# Patient Record
Sex: Female | Born: 1990 | Race: White | Hispanic: No | Marital: Married | Smoking: Never smoker
Health system: Southern US, Community
[De-identification: ages and names within clinical notes are randomized; demographics above are authoritative.]

## PROBLEM LIST (undated history)

## (undated) DIAGNOSIS — F419 Anxiety disorder, unspecified: Secondary | ICD-10-CM

## (undated) DIAGNOSIS — Z789 Other specified health status: Secondary | ICD-10-CM

## (undated) HISTORY — PX: WISDOM TOOTH EXTRACTION: SHX21

---

## 2016-10-29 DIAGNOSIS — J069 Acute upper respiratory infection, unspecified: Secondary | ICD-10-CM | POA: Diagnosis not present

## 2016-12-18 DIAGNOSIS — Z113 Encounter for screening for infections with a predominantly sexual mode of transmission: Secondary | ICD-10-CM | POA: Diagnosis not present

## 2016-12-18 DIAGNOSIS — Z6820 Body mass index (BMI) 20.0-20.9, adult: Secondary | ICD-10-CM | POA: Diagnosis not present

## 2016-12-18 DIAGNOSIS — Z01419 Encounter for gynecological examination (general) (routine) without abnormal findings: Secondary | ICD-10-CM | POA: Diagnosis not present

## 2017-07-01 DIAGNOSIS — N6320 Unspecified lump in the left breast, unspecified quadrant: Secondary | ICD-10-CM | POA: Diagnosis not present

## 2017-07-07 DIAGNOSIS — N644 Mastodynia: Secondary | ICD-10-CM | POA: Diagnosis not present

## 2017-09-23 ENCOUNTER — Ambulatory Visit
Admission: RE | Admit: 2017-09-23 | Discharge: 2017-09-23 | Disposition: A | Discharge status: Home / Self Care | Payer: BLUE CROSS/BLUE SHIELD | Source: Ambulatory Visit | Attending: Family Medicine | Admitting: Family Medicine

## 2017-09-23 ENCOUNTER — Other Ambulatory Visit: Payer: Self-pay | Admitting: Family Medicine

## 2017-09-23 DIAGNOSIS — R079 Chest pain, unspecified: Secondary | ICD-10-CM

## 2017-09-24 DIAGNOSIS — R079 Chest pain, unspecified: Secondary | ICD-10-CM | POA: Diagnosis not present

## 2018-03-17 DIAGNOSIS — Z01419 Encounter for gynecological examination (general) (routine) without abnormal findings: Secondary | ICD-10-CM | POA: Diagnosis not present

## 2018-03-17 DIAGNOSIS — Z6820 Body mass index (BMI) 20.0-20.9, adult: Secondary | ICD-10-CM | POA: Diagnosis not present

## 2018-03-17 DIAGNOSIS — Z3046 Encounter for surveillance of implantable subdermal contraceptive: Secondary | ICD-10-CM | POA: Diagnosis not present

## 2018-04-07 DIAGNOSIS — R0981 Nasal congestion: Secondary | ICD-10-CM | POA: Diagnosis not present

## 2018-04-07 DIAGNOSIS — R42 Dizziness and giddiness: Secondary | ICD-10-CM | POA: Diagnosis not present

## 2018-06-25 DIAGNOSIS — R110 Nausea: Secondary | ICD-10-CM | POA: Diagnosis not present

## 2018-06-25 DIAGNOSIS — J019 Acute sinusitis, unspecified: Secondary | ICD-10-CM | POA: Diagnosis not present

## 2018-07-13 DIAGNOSIS — F411 Generalized anxiety disorder: Secondary | ICD-10-CM | POA: Diagnosis not present

## 2018-07-16 DIAGNOSIS — F411 Generalized anxiety disorder: Secondary | ICD-10-CM | POA: Diagnosis not present

## 2018-07-16 DIAGNOSIS — F410 Panic disorder [episodic paroxysmal anxiety] without agoraphobia: Secondary | ICD-10-CM | POA: Diagnosis not present

## 2018-08-10 DIAGNOSIS — F411 Generalized anxiety disorder: Secondary | ICD-10-CM | POA: Diagnosis not present

## 2018-08-10 DIAGNOSIS — Z23 Encounter for immunization: Secondary | ICD-10-CM | POA: Diagnosis not present

## 2018-12-21 DIAGNOSIS — R05 Cough: Secondary | ICD-10-CM | POA: Diagnosis not present

## 2018-12-21 DIAGNOSIS — J309 Allergic rhinitis, unspecified: Secondary | ICD-10-CM | POA: Diagnosis not present

## 2019-01-24 DIAGNOSIS — R05 Cough: Secondary | ICD-10-CM | POA: Diagnosis not present

## 2019-01-24 DIAGNOSIS — R5383 Other fatigue: Secondary | ICD-10-CM | POA: Diagnosis not present

## 2019-01-24 DIAGNOSIS — R509 Fever, unspecified: Secondary | ICD-10-CM | POA: Diagnosis not present

## 2019-01-24 DIAGNOSIS — Z03818 Encounter for observation for suspected exposure to other biological agents ruled out: Secondary | ICD-10-CM | POA: Diagnosis not present

## 2019-02-08 DIAGNOSIS — F411 Generalized anxiety disorder: Secondary | ICD-10-CM | POA: Diagnosis not present

## 2019-03-17 DIAGNOSIS — Z20828 Contact with and (suspected) exposure to other viral communicable diseases: Secondary | ICD-10-CM | POA: Diagnosis not present

## 2019-04-25 DIAGNOSIS — Z20828 Contact with and (suspected) exposure to other viral communicable diseases: Secondary | ICD-10-CM | POA: Diagnosis not present

## 2019-05-22 DIAGNOSIS — Z91038 Other insect allergy status: Secondary | ICD-10-CM | POA: Diagnosis not present

## 2019-05-22 DIAGNOSIS — S90861A Insect bite (nonvenomous), right foot, initial encounter: Secondary | ICD-10-CM | POA: Diagnosis not present

## 2019-05-22 DIAGNOSIS — W57XXXA Bitten or stung by nonvenomous insect and other nonvenomous arthropods, initial encounter: Secondary | ICD-10-CM | POA: Diagnosis not present

## 2019-06-14 DIAGNOSIS — Z20828 Contact with and (suspected) exposure to other viral communicable diseases: Secondary | ICD-10-CM | POA: Diagnosis not present

## 2019-06-27 DIAGNOSIS — J209 Acute bronchitis, unspecified: Secondary | ICD-10-CM | POA: Diagnosis not present

## 2019-07-14 ENCOUNTER — Ambulatory Visit
Admission: RE | Admit: 2019-07-14 | Discharge: 2019-07-14 | Disposition: A | Discharge status: Home / Self Care | Payer: BC Managed Care – PPO | Source: Ambulatory Visit | Attending: Physician Assistant | Admitting: Physician Assistant

## 2019-07-14 ENCOUNTER — Other Ambulatory Visit: Payer: Self-pay | Admitting: Physician Assistant

## 2019-07-14 DIAGNOSIS — R059 Cough, unspecified: Secondary | ICD-10-CM

## 2019-07-14 DIAGNOSIS — R05 Cough: Secondary | ICD-10-CM

## 2019-08-19 DIAGNOSIS — Z131 Encounter for screening for diabetes mellitus: Secondary | ICD-10-CM | POA: Diagnosis not present

## 2019-08-19 DIAGNOSIS — Z13220 Encounter for screening for lipoid disorders: Secondary | ICD-10-CM | POA: Diagnosis not present

## 2019-08-19 DIAGNOSIS — F411 Generalized anxiety disorder: Secondary | ICD-10-CM | POA: Diagnosis not present

## 2019-08-19 DIAGNOSIS — Z0000 Encounter for general adult medical examination without abnormal findings: Secondary | ICD-10-CM | POA: Diagnosis not present

## 2020-12-24 ENCOUNTER — Other Ambulatory Visit (HOSPITAL_COMMUNITY): Payer: Self-pay | Admitting: Obstetrics and Gynecology

## 2020-12-24 ENCOUNTER — Other Ambulatory Visit: Payer: Self-pay | Admitting: Obstetrics and Gynecology

## 2020-12-24 DIAGNOSIS — O3680X0 Pregnancy with inconclusive fetal viability, not applicable or unspecified: Secondary | ICD-10-CM

## 2021-01-03 ENCOUNTER — Ambulatory Visit (HOSPITAL_COMMUNITY)
Admission: RE | Admit: 2021-01-03 | Discharge: 2021-01-03 | Disposition: A | Discharge status: Home / Self Care | Payer: Self-pay | Source: Ambulatory Visit | Attending: Obstetrics and Gynecology | Admitting: Obstetrics and Gynecology

## 2021-01-03 DIAGNOSIS — O3680X0 Pregnancy with inconclusive fetal viability, not applicable or unspecified: Secondary | ICD-10-CM | POA: Insufficient documentation

## 2021-01-23 ENCOUNTER — Ambulatory Visit: Payer: BC Managed Care – PPO

## 2021-02-08 ENCOUNTER — Other Ambulatory Visit (HOSPITAL_COMMUNITY): Payer: Self-pay | Admitting: Obstetrics and Gynecology

## 2021-02-08 DIAGNOSIS — M79604 Pain in right leg: Secondary | ICD-10-CM

## 2021-02-11 ENCOUNTER — Ambulatory Visit (HOSPITAL_COMMUNITY)
Admission: RE | Admit: 2021-02-11 | Discharge: 2021-02-11 | Disposition: A | Discharge status: Home / Self Care | Payer: 59 | Source: Ambulatory Visit | Attending: Obstetrics and Gynecology | Admitting: Obstetrics and Gynecology

## 2021-02-11 ENCOUNTER — Other Ambulatory Visit: Payer: Self-pay

## 2021-02-11 DIAGNOSIS — M79604 Pain in right leg: Secondary | ICD-10-CM

## 2021-05-01 LAB — HEPATITIS C ANTIBODY: HCV Ab: NEGATIVE

## 2021-05-16 LAB — OB RESULTS CONSOLE RUBELLA ANTIBODY, IGM: Rubella: IMMUNE

## 2021-05-16 LAB — OB RESULTS CONSOLE ABO/RH

## 2021-05-16 LAB — OB RESULTS CONSOLE RPR: RPR: NONREACTIVE

## 2021-05-16 LAB — HEPATITIS C ANTIBODY: HCV Ab: NEGATIVE

## 2021-05-16 LAB — OB RESULTS CONSOLE HIV ANTIBODY (ROUTINE TESTING): HIV: NONREACTIVE

## 2021-05-16 LAB — OB RESULTS CONSOLE HEPATITIS B SURFACE ANTIGEN: Hepatitis B Surface Ag: NEGATIVE

## 2021-06-29 LAB — OB RESULTS CONSOLE GBS: GBS: NEGATIVE

## 2021-07-29 ENCOUNTER — Inpatient Hospital Stay (HOSPITAL_COMMUNITY): Payer: 59 | Admitting: Anesthesiology

## 2021-07-29 ENCOUNTER — Encounter (HOSPITAL_COMMUNITY)
Admission: AD | Disposition: A | Discharge status: Home / Self Care | Payer: Self-pay | Source: Home / Self Care | Attending: Obstetrics and Gynecology

## 2021-07-29 ENCOUNTER — Encounter (HOSPITAL_COMMUNITY): Payer: Self-pay | Admitting: Obstetrics and Gynecology

## 2021-07-29 ENCOUNTER — Other Ambulatory Visit: Payer: Self-pay

## 2021-07-29 ENCOUNTER — Inpatient Hospital Stay (HOSPITAL_COMMUNITY)
Admission: AD | Admit: 2021-07-29 | Discharge: 2021-08-01 | DRG: 788 | Disposition: A | Discharge status: Home / Self Care | Payer: 59 | Attending: Obstetrics and Gynecology | Admitting: Obstetrics and Gynecology

## 2021-07-29 DIAGNOSIS — F419 Anxiety disorder, unspecified: Secondary | ICD-10-CM | POA: Diagnosis present

## 2021-07-29 DIAGNOSIS — O471 False labor at or after 37 completed weeks of gestation: Secondary | ICD-10-CM

## 2021-07-29 DIAGNOSIS — Z20822 Contact with and (suspected) exposure to covid-19: Secondary | ICD-10-CM | POA: Diagnosis present

## 2021-07-29 DIAGNOSIS — Z3A39 39 weeks gestation of pregnancy: Secondary | ICD-10-CM | POA: Diagnosis not present

## 2021-07-29 DIAGNOSIS — O99344 Other mental disorders complicating childbirth: Principal | ICD-10-CM | POA: Diagnosis present

## 2021-07-29 DIAGNOSIS — O26893 Other specified pregnancy related conditions, third trimester: Secondary | ICD-10-CM | POA: Diagnosis present

## 2021-07-29 HISTORY — DX: Other specified health status: Z78.9

## 2021-07-29 HISTORY — DX: Anxiety disorder, unspecified: F41.9

## 2021-07-29 LAB — RPR: RPR Ser Ql: NONREACTIVE

## 2021-07-29 LAB — TYPE AND SCREEN
ABO/RH(D): O POS
Antibody Screen: NEGATIVE

## 2021-07-29 LAB — CBC
HCT: 35.6 % — ABNORMAL LOW (ref 36.0–46.0)
Hemoglobin: 12 g/dL (ref 12.0–15.0)
MCH: 28 pg (ref 26.0–34.0)
MCHC: 33.7 g/dL (ref 30.0–36.0)
MCV: 83.2 fL (ref 80.0–100.0)
Platelets: 246 10*3/uL (ref 150–400)
RBC: 4.28 MIL/uL (ref 3.87–5.11)
RDW: 13.7 % (ref 11.5–15.5)
WBC: 15.3 10*3/uL — ABNORMAL HIGH (ref 4.0–10.5)
nRBC: 0 % (ref 0.0–0.2)

## 2021-07-29 LAB — RESP PANEL BY RT-PCR (FLU A&B, COVID) ARPGX2
Influenza A by PCR: NEGATIVE
Influenza B by PCR: NEGATIVE
SARS Coronavirus 2 by RT PCR: NEGATIVE

## 2021-07-29 SURGERY — Surgical Case
Anesthesia: Epidural

## 2021-07-29 MED ORDER — SENNOSIDES-DOCUSATE SODIUM 8.6-50 MG PO TABS
2.0000 | ORAL_TABLET | ORAL | Status: DC
  Administered 2021-07-30 – 2021-08-01 (×4): 2 via ORAL
  Filled 2021-07-29 (×5): qty 2

## 2021-07-29 MED ORDER — CEFAZOLIN SODIUM-DEXTROSE 2-4 GM/100ML-% IV SOLN
INTRAVENOUS | Status: AC
  Filled 2021-07-29: qty 100

## 2021-07-29 MED ORDER — DIPHENHYDRAMINE HCL 50 MG/ML IJ SOLN: 12.5000 mg | INTRAMUSCULAR | Status: DC | PRN

## 2021-07-29 MED ORDER — KETOROLAC TROMETHAMINE 30 MG/ML IJ SOLN
30.0000 mg | Freq: Four times a day (QID) | INTRAMUSCULAR | Status: AC | PRN
  Administered 2021-07-29 – 2021-07-30 (×3): 30 mg via INTRAVENOUS
  Filled 2021-07-29 (×3): qty 1

## 2021-07-29 MED ORDER — OXYTOCIN-SODIUM CHLORIDE 30-0.9 UT/500ML-% IV SOLN: 2.5000 [IU]/h | INTRAVENOUS | Status: AC

## 2021-07-29 MED ORDER — FENTANYL CITRATE (PF) 100 MCG/2ML IJ SOLN
100.0000 ug | INTRAMUSCULAR | Status: DC | PRN
  Administered 2021-07-29: 100 ug via INTRAVENOUS
  Filled 2021-07-29: qty 2

## 2021-07-29 MED ORDER — CEFAZOLIN SODIUM-DEXTROSE 2-3 GM-%(50ML) IV SOLR
INTRAVENOUS | Status: DC | PRN
  Administered 2021-07-29: 2 g via INTRAVENOUS

## 2021-07-29 MED ORDER — LACTATED RINGERS IV SOLN: INTRAVENOUS | Status: DC | PRN

## 2021-07-29 MED ORDER — OXYTOCIN-SODIUM CHLORIDE 30-0.9 UT/500ML-% IV SOLN
INTRAVENOUS | Status: AC
  Filled 2021-07-29: qty 500

## 2021-07-29 MED ORDER — EPHEDRINE 5 MG/ML INJ: 10.0000 mg | INTRAVENOUS | Status: DC | PRN

## 2021-07-29 MED ORDER — LIDOCAINE HCL (PF) 1 % IJ SOLN: 30.0000 mL | INTRAMUSCULAR | Status: DC | PRN

## 2021-07-29 MED ORDER — MORPHINE SULFATE (PF) 0.5 MG/ML IJ SOLN
INTRAMUSCULAR | Status: DC | PRN
  Administered 2021-07-29: 3 mg via EPIDURAL

## 2021-07-29 MED ORDER — MORPHINE SULFATE (PF) 0.5 MG/ML IJ SOLN
INTRAMUSCULAR | Status: AC
  Filled 2021-07-29: qty 10

## 2021-07-29 MED ORDER — SCOPOLAMINE 1 MG/3DAYS TD PT72
MEDICATED_PATCH | TRANSDERMAL | Status: AC
  Filled 2021-07-29: qty 1

## 2021-07-29 MED ORDER — NALOXONE HCL 4 MG/10ML IJ SOLN
1.0000 ug/kg/h | INTRAVENOUS | Status: DC | PRN
  Filled 2021-07-29: qty 5

## 2021-07-29 MED ORDER — NALBUPHINE HCL 10 MG/ML IJ SOLN
5.0000 mg | INTRAMUSCULAR | Status: DC | PRN
  Administered 2021-07-29 – 2021-07-30 (×3): 5 mg via INTRAVENOUS
  Filled 2021-07-29 (×2): qty 1

## 2021-07-29 MED ORDER — NALOXONE HCL 0.4 MG/ML IJ SOLN: 0.4000 mg | INTRAMUSCULAR | Status: DC | PRN

## 2021-07-29 MED ORDER — DEXMEDETOMIDINE (PRECEDEX) IN NS 20 MCG/5ML (4 MCG/ML) IV SYRINGE
PREFILLED_SYRINGE | INTRAVENOUS | Status: AC
  Filled 2021-07-29: qty 5

## 2021-07-29 MED ORDER — DEXAMETHASONE SODIUM PHOSPHATE 4 MG/ML IJ SOLN
INTRAMUSCULAR | Status: AC
  Filled 2021-07-29: qty 1

## 2021-07-29 MED ORDER — FENTANYL CITRATE (PF) 100 MCG/2ML IJ SOLN: 25.0000 ug | INTRAMUSCULAR | Status: DC | PRN

## 2021-07-29 MED ORDER — FENTANYL CITRATE (PF) 100 MCG/2ML IJ SOLN
INTRAMUSCULAR | Status: AC
  Filled 2021-07-29: qty 2

## 2021-07-29 MED ORDER — LACTATED RINGERS IV SOLN: INTRAVENOUS | Status: DC

## 2021-07-29 MED ORDER — ONDANSETRON HCL 4 MG/2ML IJ SOLN
INTRAMUSCULAR | Status: DC | PRN
  Administered 2021-07-29: 4 mg via INTRAVENOUS

## 2021-07-29 MED ORDER — NALBUPHINE HCL 10 MG/ML IJ SOLN
5.0000 mg | Freq: Once | INTRAMUSCULAR | Status: DC | PRN
  Filled 2021-07-29: qty 1

## 2021-07-29 MED ORDER — TERBUTALINE SULFATE 1 MG/ML IJ SOLN
INTRAMUSCULAR | Status: AC
  Filled 2021-07-29: qty 1

## 2021-07-29 MED ORDER — LIDOCAINE-EPINEPHRINE (PF) 2 %-1:200000 IJ SOLN
INTRAMUSCULAR | Status: AC
  Filled 2021-07-29: qty 20

## 2021-07-29 MED ORDER — OXYTOCIN 10 UNIT/ML IJ SOLN: 10.0000 [IU] | Freq: Once | INTRAMUSCULAR | Status: DC

## 2021-07-29 MED ORDER — OXYCODONE-ACETAMINOPHEN 5-325 MG PO TABS: 2.0000 | ORAL_TABLET | ORAL | Status: DC | PRN

## 2021-07-29 MED ORDER — ACETAMINOPHEN 10 MG/ML IV SOLN: 1000.0000 mg | Freq: Once | INTRAVENOUS | Status: DC | PRN

## 2021-07-29 MED ORDER — ACETAMINOPHEN 500 MG PO TABS
1000.0000 mg | ORAL_TABLET | Freq: Four times a day (QID) | ORAL | Status: DC
  Administered 2021-07-29 – 2021-08-01 (×12): 1000 mg via ORAL
  Filled 2021-07-29 (×12): qty 2

## 2021-07-29 MED ORDER — ZOLPIDEM TARTRATE 5 MG PO TABS: 5.0000 mg | ORAL_TABLET | Freq: Every evening | ORAL | Status: DC | PRN

## 2021-07-29 MED ORDER — OXYTOCIN BOLUS FROM INFUSION: 333.0000 mL | Freq: Once | INTRAVENOUS | Status: DC

## 2021-07-29 MED ORDER — ACETAMINOPHEN 500 MG PO TABS: 1000.0000 mg | ORAL_TABLET | Freq: Four times a day (QID) | ORAL | Status: DC

## 2021-07-29 MED ORDER — KETOROLAC TROMETHAMINE 30 MG/ML IJ SOLN
30.0000 mg | Freq: Once | INTRAMUSCULAR | Status: AC | PRN
  Administered 2021-07-29: 30 mg via INTRAVENOUS

## 2021-07-29 MED ORDER — VENLAFAXINE HCL 37.5 MG PO TABS: 37.5000 mg | ORAL_TABLET | Freq: Every morning | ORAL | Status: DC

## 2021-07-29 MED ORDER — FENTANYL CITRATE (PF) 100 MCG/2ML IJ SOLN
INTRAMUSCULAR | Status: DC | PRN
  Administered 2021-07-29 (×2): 50 ug via INTRAVENOUS

## 2021-07-29 MED ORDER — DEXMEDETOMIDINE (PRECEDEX) IN NS 20 MCG/5ML (4 MCG/ML) IV SYRINGE
PREFILLED_SYRINGE | INTRAVENOUS | Status: DC | PRN
  Administered 2021-07-29 (×2): 8 ug via INTRAVENOUS

## 2021-07-29 MED ORDER — SCOPOLAMINE 1 MG/3DAYS TD PT72
1.0000 | MEDICATED_PATCH | Freq: Once | TRANSDERMAL | Status: AC
  Administered 2021-07-29: 1.5 mg via TRANSDERMAL

## 2021-07-29 MED ORDER — WITCH HAZEL-GLYCERIN EX PADS: 1.0000 "application " | MEDICATED_PAD | CUTANEOUS | Status: DC | PRN

## 2021-07-29 MED ORDER — MIDAZOLAM HCL 2 MG/2ML IJ SOLN
INTRAMUSCULAR | Status: AC
  Filled 2021-07-29: qty 2

## 2021-07-29 MED ORDER — DIPHENHYDRAMINE HCL 25 MG PO CAPS: 25.0000 mg | ORAL_CAPSULE | Freq: Four times a day (QID) | ORAL | Status: DC | PRN

## 2021-07-29 MED ORDER — KETOROLAC TROMETHAMINE 30 MG/ML IJ SOLN: 30.0000 mg | Freq: Four times a day (QID) | INTRAMUSCULAR | Status: AC | PRN

## 2021-07-29 MED ORDER — SIMETHICONE 80 MG PO CHEW
80.0000 mg | CHEWABLE_TABLET | ORAL | Status: DC | PRN
  Administered 2021-07-30 – 2021-07-31 (×2): 80 mg via ORAL
  Filled 2021-07-29 (×2): qty 1

## 2021-07-29 MED ORDER — DIBUCAINE (PERIANAL) 1 % EX OINT: 1.0000 "application " | TOPICAL_OINTMENT | CUTANEOUS | Status: DC | PRN

## 2021-07-29 MED ORDER — SODIUM CHLORIDE 0.9% FLUSH: 3.0000 mL | INTRAVENOUS | Status: DC | PRN

## 2021-07-29 MED ORDER — SIMETHICONE 80 MG PO CHEW
80.0000 mg | CHEWABLE_TABLET | Freq: Three times a day (TID) | ORAL | Status: DC
  Administered 2021-07-29 – 2021-08-01 (×9): 80 mg via ORAL
  Filled 2021-07-29 (×9): qty 1

## 2021-07-29 MED ORDER — PHENYLEPHRINE 40 MCG/ML (10ML) SYRINGE FOR IV PUSH (FOR BLOOD PRESSURE SUPPORT): 80.0000 ug | PREFILLED_SYRINGE | INTRAVENOUS | Status: DC | PRN

## 2021-07-29 MED ORDER — LACTATED RINGERS IV SOLN: 500.0000 mL | INTRAVENOUS | Status: DC | PRN

## 2021-07-29 MED ORDER — SOD CITRATE-CITRIC ACID 500-334 MG/5ML PO SOLN: 30.0000 mL | ORAL | Status: DC | PRN

## 2021-07-29 MED ORDER — ONDANSETRON HCL 4 MG/2ML IJ SOLN: 4.0000 mg | Freq: Three times a day (TID) | INTRAMUSCULAR | Status: DC | PRN

## 2021-07-29 MED ORDER — NALBUPHINE HCL 10 MG/ML IJ SOLN
5.0000 mg | INTRAMUSCULAR | Status: DC | PRN
Start: 2021-07-29 — End: 2021-08-01

## 2021-07-29 MED ORDER — DIPHENHYDRAMINE HCL 25 MG PO CAPS: 25.0000 mg | ORAL_CAPSULE | ORAL | Status: DC | PRN

## 2021-07-29 MED ORDER — IBUPROFEN 600 MG PO TABS
600.0000 mg | ORAL_TABLET | Freq: Four times a day (QID) | ORAL | Status: AC
  Administered 2021-07-30 – 2021-08-01 (×8): 600 mg via ORAL
  Filled 2021-07-29 (×8): qty 1

## 2021-07-29 MED ORDER — VENLAFAXINE HCL 37.5 MG PO TABS
37.5000 mg | ORAL_TABLET | Freq: Every morning | ORAL | Status: DC
  Administered 2021-07-29 – 2021-08-01 (×4): 37.5 mg via ORAL
  Filled 2021-07-29 (×4): qty 1

## 2021-07-29 MED ORDER — OXYCODONE-ACETAMINOPHEN 5-325 MG PO TABS: 1.0000 | ORAL_TABLET | ORAL | Status: DC | PRN

## 2021-07-29 MED ORDER — NALBUPHINE HCL 10 MG/ML IJ SOLN: 5.0000 mg | Freq: Once | INTRAMUSCULAR | Status: DC | PRN

## 2021-07-29 MED ORDER — OXYCODONE HCL 5 MG PO TABS
5.0000 mg | ORAL_TABLET | ORAL | Status: DC | PRN
  Administered 2021-07-30 – 2021-08-01 (×6): 5 mg via ORAL
  Filled 2021-07-29 (×6): qty 1

## 2021-07-29 MED ORDER — FENTANYL-BUPIVACAINE-NACL 0.5-0.125-0.9 MG/250ML-% EP SOLN
12.0000 mL/h | EPIDURAL | Status: DC | PRN
  Administered 2021-07-29: 12 mL/h via EPIDURAL
  Filled 2021-07-29: qty 250

## 2021-07-29 MED ORDER — MENTHOL 3 MG MT LOZG: 1.0000 | LOZENGE | OROMUCOSAL | Status: DC | PRN

## 2021-07-29 MED ORDER — LACTATED RINGERS IV SOLN
500.0000 mL | Freq: Once | INTRAVENOUS | Status: AC
  Administered 2021-07-29: 500 mL via INTRAVENOUS

## 2021-07-29 MED ORDER — ACETAMINOPHEN 325 MG PO TABS: 650.0000 mg | ORAL_TABLET | ORAL | Status: DC | PRN

## 2021-07-29 MED ORDER — LIDOCAINE-EPINEPHRINE (PF) 2 %-1:200000 IJ SOLN
INTRAMUSCULAR | Status: DC | PRN
  Administered 2021-07-29 (×2): 5 mL via EPIDURAL
  Administered 2021-07-29: 10 mL via EPIDURAL
  Administered 2021-07-29: 5 mL via EPIDURAL

## 2021-07-29 MED ORDER — KETOROLAC TROMETHAMINE 30 MG/ML IJ SOLN
INTRAMUSCULAR | Status: AC
  Filled 2021-07-29: qty 1

## 2021-07-29 MED ORDER — PRENATAL MULTIVITAMIN CH
1.0000 | ORAL_TABLET | Freq: Every day | ORAL | Status: DC
  Administered 2021-07-29 – 2021-08-01 (×4): 1 via ORAL
  Filled 2021-07-29 (×4): qty 1

## 2021-07-29 MED ORDER — COCONUT OIL OIL: 1.0000 "application " | TOPICAL_OIL | Status: DC | PRN

## 2021-07-29 MED ORDER — OXYTOCIN-SODIUM CHLORIDE 30-0.9 UT/500ML-% IV SOLN
INTRAVENOUS | Status: DC | PRN
  Administered 2021-07-29: 300 mL via INTRAVENOUS

## 2021-07-29 MED ORDER — ONDANSETRON HCL 4 MG/2ML IJ SOLN: 4.0000 mg | Freq: Four times a day (QID) | INTRAMUSCULAR | Status: DC | PRN

## 2021-07-29 MED ORDER — OXYTOCIN-SODIUM CHLORIDE 30-0.9 UT/500ML-% IV SOLN: 2.5000 [IU]/h | INTRAVENOUS | Status: DC

## 2021-07-29 SURGICAL SUPPLY — 45 items
BARRIER ADHS 3X4 INTERCEED (GAUZE/BANDAGES/DRESSINGS) ×4 IMPLANT
BENZOIN TINCTURE PRP APPL 2/3 (GAUZE/BANDAGES/DRESSINGS) ×2 IMPLANT
CHLORAPREP W/TINT 26ML (MISCELLANEOUS) ×2 IMPLANT
CLAMP CORD UMBIL (MISCELLANEOUS) IMPLANT
CLOTH BEACON ORANGE TIMEOUT ST (SAFETY) ×2 IMPLANT
DRAPE C SECTION CLR SCREEN (DRAPES) ×2 IMPLANT
DRSG OPSITE POSTOP 4X10 (GAUZE/BANDAGES/DRESSINGS) ×2 IMPLANT
ELECT REM PT RETURN 9FT ADLT (ELECTROSURGICAL) ×2
ELECTRODE REM PT RTRN 9FT ADLT (ELECTROSURGICAL) ×1 IMPLANT
EXTRACTOR VACUUM M CUP 4 TUBE (SUCTIONS) IMPLANT
GLOVE BIOGEL PI IND STRL 7.0 (GLOVE) ×2 IMPLANT
GLOVE BIOGEL PI INDICATOR 7.0 (GLOVE) ×2
GLOVE ECLIPSE 6.5 STRL STRAW (GLOVE) ×2 IMPLANT
GOWN STRL REUS W/TWL LRG LVL3 (GOWN DISPOSABLE) ×4 IMPLANT
KIT ABG SYR 3ML LUER SLIP (SYRINGE) IMPLANT
NEEDLE HYPO 22GX1.5 SAFETY (NEEDLE) ×2 IMPLANT
NEEDLE HYPO 25X5/8 SAFETYGLIDE (NEEDLE) IMPLANT
NS IRRIG 1000ML POUR BTL (IV SOLUTION) ×2 IMPLANT
PACK C SECTION WH (CUSTOM PROCEDURE TRAY) ×2 IMPLANT
PAD OB MATERNITY 4.3X12.25 (PERSONAL CARE ITEMS) ×2 IMPLANT
RTRCTR C-SECT PINK 25CM LRG (MISCELLANEOUS) IMPLANT
STRIP CLOSURE SKIN 1/2X4 (GAUZE/BANDAGES/DRESSINGS) IMPLANT
STRIP SURGICAL 1/4 X 6 IN (GAUZE/BANDAGES/DRESSINGS) ×2 IMPLANT
SUT CHROMIC GUT AB #0 18 (SUTURE) IMPLANT
SUT MNCRL 0 VIOLET CTX 36 (SUTURE) ×4 IMPLANT
SUT MON AB 2-0 SH 27 (SUTURE)
SUT MON AB 2-0 SH27 (SUTURE) IMPLANT
SUT MON AB 3-0 SH 27 (SUTURE)
SUT MON AB 3-0 SH27 (SUTURE) IMPLANT
SUT MON AB 4-0 PS1 27 (SUTURE) IMPLANT
SUT MONOCRYL 0 CTX 36 (SUTURE) ×4
SUT PLAIN 2 0 (SUTURE) ×1
SUT PLAIN 2 0 XLH (SUTURE) IMPLANT
SUT PLAIN ABS 2-0 CT1 27XMFL (SUTURE) ×1 IMPLANT
SUT VIC AB 0 CT1 36 (SUTURE) ×4 IMPLANT
SUT VIC AB 2-0 CT1 27 (SUTURE) ×1
SUT VIC AB 2-0 CT1 TAPERPNT 27 (SUTURE) ×1 IMPLANT
SUT VIC AB 4-0 KS 27 (SUTURE) ×2 IMPLANT
SUT VIC AB 4-0 PS2 27 (SUTURE) IMPLANT
SUT VIC AB 4-0 SH 27 (SUTURE) ×1
SUT VIC AB 4-0 SH 27XANBCTRL (SUTURE) ×1 IMPLANT
SYR CONTROL 10ML LL (SYRINGE) ×2 IMPLANT
TOWEL OR 17X24 6PK STRL BLUE (TOWEL DISPOSABLE) ×2 IMPLANT
TRAY FOLEY W/BAG SLVR 14FR LF (SET/KITS/TRAYS/PACK) IMPLANT
WATER STERILE IRR 1000ML POUR (IV SOLUTION) ×2 IMPLANT

## 2021-07-29 NOTE — MAU Provider Note (Signed)
History     Chief Complaint  Patient presents with   Contractions   30 yo G1P0 F @ 39 4/[redacted] wk  Gestation presented for labor check. (+) ctx OB History     Gravida  1   Para      Term      Preterm      AB      Living         SAB      IAB      Ectopic      Multiple      Live Births              Past Medical History:  Diagnosis Date   Medical history non-contributory       History reviewed. No pertinent family history.  Social History   Tobacco Use   Smoking status: Never   Smokeless tobacco: Never  Vaping Use   Vaping Use: Never used  Substance Use Topics   Alcohol use: Never   Drug use: Never    Allergies: Not on File  Medications Prior to Admission  Medication Sig Dispense Refill Last Dose   Prenatal Vit-Fe Fumarate-FA (PRENATAL VITAMINS PO) Take 1 tablet by mouth daily.   07/28/2021   venlafaxine (EFFEXOR) 37.5 MG tablet Take 37.5 mg by mouth in the morning.   07/28/2021     Physical Exam   Blood pressure 134/73, pulse 70, temperature 97.8 F (36.6 C), temperature source Oral, resp. rate 17, height 5\' 6"  (1.676 m), weight 82.6 kg, last menstrual period 10/26/2019, SpO2 96 %.  VE per RN 2/70/-2( unchanged after 2hrs)  Tracing reviewed.   Baseline 130 (+) accel to 150 occ variable Ctx q 2-4 mins( couplets) IMP: False labor >37 wk Term gestation P) d/c home. Labor precautions  MDM   10/28/2019, MD 5:34 AM 07/29/2021

## 2021-07-29 NOTE — Lactation Note (Addendum)
This note was copied from a baby's chart. Lactation Consultation Note  Patient Name: Kelly [name] [id number] Date: 07/29/2021 Reason for consult: Initial assessment;Mother's request;Primapara;1st time breastfeeding;Term;Breastfeeding assistance Age:28 hours  LC assisted with latching infant at breast with compression. Breasts softening in midst of the feeding.   Mom states breasts leaking all throughout her pregnancy, and breast quiet full.  Infant latched and breast begun to soften.  Mom to post pump after latching to relieve level of fullness with manual pump sized with 21 flange  Plan 1. To feed based on cues 8-12x 24hr period. Mom to offer breast with compression and look for signs for milk transfer.  2. Mom to post pump with manual to relieve level of fullness.  3. I and O sheet reviewed.  Mom has electric pump at home.  All questions answered at the end of the visit.   Maternal Data Has patient been taught Hand Expression?: Yes Does the patient have breastfeeding experience prior to this delivery?: No  Feeding Mother's Current Feeding Choice: Breast Milk  LATCH Score Latch: Repeated attempts needed to sustain latch, nipple held in mouth throughout feeding, stimulation needed to elicit sucking reflex.  Audible Swallowing: Spontaneous and intermittent  Type of Nipple: Everted at rest and after stimulation  Comfort (Breast/Nipple): Soft / non-tender  Hold (Positioning): Assistance needed to correctly position infant at breast and maintain latch.  LATCH Score: 8   Lactation Tools Discussed/Used   Interventions Interventions: Breast feeding basics reviewed;Support pillows;Education;Assisted with latch;Position options;Skin to skin;Expressed Clinical research associate;Infant Driven Feeding Algorithm education;Hand express;Breast compression  Discharge Pump: Personal  Consult Status Consult Status: Follow-up Date: 07/30/21 Follow-up type:  In-patient    Kelly [name]  Kelly [name] 07/29/2021, 6:54 PM

## 2021-07-29 NOTE — Anesthesia Postprocedure Evaluation (Signed)
Anesthesia Post Note  Patient: Kelly [name]  Procedure(s) Performed: CESAREAN SECTION     Patient location during evaluation: PACU Anesthesia Type: Epidural Level of consciousness: awake and alert Pain management: pain level controlled Vital Signs Assessment: post-procedure vital signs reviewed and stable Respiratory status: spontaneous breathing, nonlabored ventilation, respiratory function stable and patient connected to nasal cannula oxygen Cardiovascular status: stable and blood pressure returned to baseline Postop Assessment: no apparent nausea or vomiting, no headache and epidural receding Anesthetic complications: no   No notable events documented.  Last Vitals:  Vitals:   07/29/21 1108 07/29/21 1215  BP: 120/67 129/67  Pulse: 100 83  Resp: 16 16  Temp: 36.5 C 36.9 C  SpO2: 100% 97%    Last Pain:  Vitals:   07/29/21 1215  TempSrc: Oral  PainSc: 1    Pain Goal: Patients Stated Pain Goal: 0 (07/29/21 0321)                 Zamyiah Tino L Laurie Penado

## 2021-07-29 NOTE — Lactation Note (Signed)
This note was copied from a baby's chart. Lactation Consultation Note  Patient Name: Kelly [name] [id number] Date: 07/29/2021   Age:28 hours  Maternal Data   LC went in to assist with breastfeeding. RN, Candace Cruise, working with Mom at this time. LC to return later.   Feeding    LATCH Score Latch: Grasps breast easily, tongue down, lips flanged, rhythmical sucking.  Audible Swallowing: A few with stimulation  Type of Nipple: Everted at rest and after stimulation  Comfort (Breast/Nipple): Soft / non-tender  Hold (Positioning): Assistance needed to correctly position infant at breast and maintain latch.  LATCH Score: 8   Lactation Tools Discussed/Used    Interventions    Discharge    Consult Status      Kelly [name]  [name] 07/29/2021, 5:01 PM

## 2021-07-29 NOTE — H&P (Signed)
Kelly [name] is a 30 y.o. female presenting @.39 4/[redacted] week gestation admitted in labor. GBS cx neg.  OB History     Gravida  1   Para      Term      Preterm      AB      Living         SAB      IAB      Ectopic      Multiple      Live Births             Past Medical History:  Diagnosis Date   Medical history non-contributory     Family History: family history is not on file. Social History:  reports that she has never smoked. She has never used smokeless tobacco. She reports that she does not drink alcohol and does not use drugs.     Maternal Diabetes: No Genetic Screening: Normal Maternal Ultrasounds/Referrals: Normal Fetal Ultrasounds or other Referrals:  None Maternal Substance Abuse:  No Significant Maternal Medications:  Meds include: Other: Effexor Significant Maternal Lab Results:  Group B Strep negative Other Comments:   anxiety disorder  Review of Systems  All other systems reviewed and are negative. History Dilation: 3.5 Effacement (%): 90 Station: -2 Exam by:: San Jetty, RN Blood pressure 134/73, pulse 70, temperature 97.8 F (36.6 C), temperature source Oral, resp. rate 17, height 5\' 6"  (1.676 m), weight 82.6 kg, last menstrual period 10/26/2019, SpO2 96 %. Exam Physical Exam Constitutional:      Appearance: Normal appearance.  Eyes:     Extraocular Movements: Extraocular movements intact.  Cardiovascular:     Rate and Rhythm: Regular rhythm.  Pulmonary:     Breath sounds: Normal breath sounds.  Musculoskeletal:        General: Normal range of motion.     Cervical back: Neck supple.  Skin:    General: Skin is warm.  Neurological:     General: No focal deficit present.     Mental Status: She is alert and oriented to person, place, and time.  Psychiatric:        Mood and Affect: Mood normal.        Behavior: Behavior normal.    Prenatal labs: ABO, Rh:  O positive Antibody:  negative Rubella:  Immune RPR:   NR HBsAg:    neg HIV:   neg GBS:  negative HCV neg   Assessment/Plan: Labor  Term gestation Anxiety disorder P) admit routine labs. Epidural.  Amniotomy prn cont effexor   Karra Pink A Kourosh Jablonsky 07/29/2021, 6:28 AM

## 2021-07-29 NOTE — Transfer of Care (Signed)
Immediate Anesthesia Transfer of Care Note  Patient: Kelly [name]  Procedure(s) Performed: CESAREAN SECTION  Patient Location: PACU  Anesthesia Type:Epidural  Level of Consciousness: awake  Airway & Oxygen Therapy: Patient Spontanous Breathing  Post-op Assessment: Report given to RN and Post -op Vital signs reviewed and stable  Post vital signs: Reviewed and stable  Last Vitals:  Vitals Value Taken Time  BP 125/51 07/29/21 0950  Temp    Pulse 105 07/29/21 0952  Resp 22 07/29/21 0952  SpO2 96 % 07/29/21 0952    Last Pain:  Vitals:   07/29/21 0635  TempSrc:   PainSc: 8       Patients Stated Pain Goal: 0 (07/29/21 0321)  Complications: No notable events documented.

## 2021-07-29 NOTE — Anesthesia Procedure Notes (Signed)
Epidural Patient location during procedure: OB Start time: 07/29/2021 7:55 AM End time: 07/29/2021 8:05 AM  Staffing Anesthesiologist: Elmer Picker, MD Performed: anesthesiologist   Preanesthetic Checklist Completed: patient identified, IV checked, risks and benefits discussed, monitors and equipment checked, pre-op evaluation and timeout performed  Epidural Patient position: sitting Prep: DuraPrep and site prepped and draped Patient monitoring: continuous pulse ox, blood pressure, heart rate and cardiac monitor Approach: midline Location: L3-L4 Injection technique: LOR air  Needle:  Needle type: Tuohy  Needle gauge: 17 G Needle length: 9 cm Needle insertion depth: 5 cm Catheter type: closed end flexible Catheter size: 19 Gauge Catheter at skin depth: 10 cm Test dose: negative  Assessment Sensory level: T8 Events: blood not aspirated, injection not painful, no injection resistance, no paresthesia and negative IV test  Additional Notes Patient identified. Risks/Benefits/Options discussed with patient including but not limited to bleeding, infection, nerve damage, paralysis, failed block, incomplete pain control, headache, blood pressure changes, nausea, vomiting, reactions to medication both or allergic, itching and postpartum back pain. Confirmed with bedside nurse the patient's most recent platelet count. Confirmed with patient that they are not currently taking any anticoagulation, have any bleeding history or any family history of bleeding disorders. Patient expressed understanding and wished to proceed. All questions were answered. Sterile technique was used throughout the entire procedure. Please see nursing notes for vital signs. Test dose was given through epidural catheter and negative prior to continuing to dose epidural or start infusion. Warning signs of high block given to the patient including shortness of breath, tingling/numbness in hands, complete motor block,  or any concerning symptoms with instructions to call for help. Patient was given instructions on fall risk and not to get out of bed. All questions and concerns addressed with instructions to call with any issues or inadequate analgesia.  Reason for block:procedure for pain

## 2021-07-29 NOTE — MAU Note (Signed)
Pt hesitant to go home due to pain of contractions. States the contractions are becoming increasingly more painful. Would like to be rechecked at 0600 prior to discharge.

## 2021-07-29 NOTE — Op Note (Signed)
[name], [name] MEDICAL RECORD NO: [id number] ACCOUNT NO: [id number] DATE OF BIRTH: [birth year] FACILITY: [facility] LOCATION: [facility] PHYSICIAN: Kristene Liberati A. Cherly Hensen, MD  Operative Report   DATE OF PROCEDURE: 07/29/2021  PREOPERATIVE DIAGNOSES:  Nonreassuring fetal tracing.  Term gestation.  PROCEDURES:  Emergency primary cesarean section, Kerr hysterotomy.  POSTOPERATIVE DIAGNOSES:  Nonreassuring fetal tracing. Term gestation.  ANESTHESIA:  Epidural.  SURGEON:  Dymphna Wadley A. Cherly Hensen, MD  ASSISTANT:  Dr. Philip Aspen.  DESCRIPTION OF PROCEDURE:  Under adequate epidural anesthesia, the patient was placed in the supine position with a left lateral tilt.  She was quickly prepped for an emergency surgery.  Pfannenstiel skin incision was then made, carried down to the  rectus fascia.  Rectus fascia was opened transversely.  Rectus fascia was then bluntly and sharply dissected off the rectus muscles in superior and inferior fashion.  The rectus muscle was split in the midline.  The parietal peritoneum was entered  bluntly and extended bluntly.  A low transverse incision was then made and extended bluntly in a cephalic and caudad manner.  Clear amniotic fluid was noted.  Subsequent delivery of a live female from occiput posterior position with a cord around the neck  x1 reduced was accomplished.  The baby had delayed cord clamped x1 minute since he was vigorously crying at the time of delivery.  The cord was clamped and cut.  The baby was transferred to the awaiting pediatricians who subsequently assigned Apgars of 8  and 9 at one and five minutes.  The placenta was spontaneous and intact, not sent. The Alexis self-retaining retractor was then placed. Uterine cavity was cleaned of debris.  Uterine incision had no extension.  Uterine incision was closed in two layers, the first layer of 0 Monocryl running locked stitch  and second layer imbricating using 0 Monocryl suture.  Figure-of-eight suture  was placed on the right of the incision for hemostasis of the area that was bleeding.  The remaining incision with otherwise good hemostasis.  The abdomen was irrigated,  suctioned of debris.  Interceed was placed overlying the lower uterine segment in an inverted T fashion.  Normal tubes and ovaries were noted bilaterally.  The Alexis retractor was subsequently removed.  The parietal  peritoneum was closed with 2-0 Vicryl in a running stitch.  Rectus fascia was closed with 0 Vicryl x2.  The subcutaneous area was irrigated, small bleeders were cauterized, interrupted 2-0 plain sutures were placed, and skin approximated using 4-0 Vicryl  subcuticular closure.  Benzoin and Steri-Strips were placed.  SPECIMENS:  Placenta, not sent to Pathology.  ESTIMATED BLOOD LOSS: 386 mL.  URINE OUTPUT:  150 mL.  INTRAOPERATIVE FLUIDS:  2 liters.  COMPLICATIONS:  None.  The patient tolerated the procedure well and was transferred to recovery room in stable condition.   [facility] D: 07/29/2021 9:42:19 am T: 07/29/2021 10:07:00 am  JOB: [id number]/ [id number]

## 2021-07-29 NOTE — MAU Note (Signed)
..  Kelly [name] is a 30 y.o. at [redacted]w[redacted]d here in MAU reporting: contractions since 10pm that are now 3-7 minutes. Denies vaginal bleeding or leaking of fluid. Reports she lost her mucous plug this morning.  Pain score: 7/10 Vitals:   07/29/21 0320  BP: (!) 147/71  Pulse: 91  Resp: 18  Temp: 97.8 F (36.6 C)  SpO2: 100%     FHT: 151

## 2021-07-29 NOTE — Progress Notes (Signed)
Late entry noted Pt epidural placement placed w/in last 1/2 hr She had a 2 1/2 mins decel in MAU prior to transfer to L&D Pt continued to have intermittent variable deceleration Tracing reviewed. Baseline 130  (+) good variability VE done  AROM clear fluid ISE/IUPC placed for planned amnioinfusion  FHR deceleration subsequently down to 70's unassociated with Ctx* abdomen soft Persistent deceleration noted not responsive to maternal positional Changes or abdominal stimulation. Contraction then started FHR continued low... emergency C/S called Pt and husband notified  To OR on hand and knee position

## 2021-07-29 NOTE — Anesthesia Preprocedure Evaluation (Signed)
Anesthesia Evaluation  °Patient identified by MRN, date of birth, ID band °Patient awake ° ° ° °Reviewed: °Allergy & Precautions, NPO status , Patient's Chart, lab work & pertinent test results ° °Airway °Mallampati: II ° °TM Distance: >3 FB °Neck ROM: Full ° ° ° Dental °no notable dental hx. ° °  °Pulmonary °neg pulmonary ROS,  °  °Pulmonary exam normal °breath sounds clear to auscultation ° ° ° ° ° ° Cardiovascular °negative cardio ROS °Normal cardiovascular exam °Rhythm:Regular Rate:Normal ° ° °  °Neuro/Psych °PSYCHIATRIC DISORDERS Anxiety negative neurological ROS °   ° GI/Hepatic °negative GI ROS, Neg liver ROS,   °Endo/Other  °negative endocrine ROS ° Renal/GU °negative Renal ROS  °negative genitourinary °  °Musculoskeletal °negative musculoskeletal ROS °(+)  ° Abdominal °  °Peds ° Hematology °negative hematology ROS °(+)   °Anesthesia Other Findings ° ° Reproductive/Obstetrics °(+) Pregnancy ° °  ° ° ° ° ° ° ° ° ° ° ° ° ° °  °  ° ° ° ° ° ° ° ° °Anesthesia Physical °Anesthesia Plan ° °ASA: 2 ° °Anesthesia Plan: Epidural  ° °Post-op Pain Management:   ° °Induction:  ° °PONV Risk Score and Plan: Treatment may vary due to age or medical condition ° °Airway Management Planned: Natural Airway ° °Additional Equipment:  ° °Intra-op Plan:  ° °Post-operative Plan:  ° °Informed Consent: I have reviewed the patients History and Physical, chart, labs and discussed the procedure including the risks, benefits and alternatives for the proposed anesthesia with the patient or authorized representative who has indicated his/her understanding and acceptance.  ° ° ° ° ° °Plan Discussed with: Anesthesiologist ° °Anesthesia Plan Comments: (Patient identified. Risks, benefits, options discussed with patient including but not limited to bleeding, infection, nerve damage, paralysis, failed block, incomplete pain control, headache, blood pressure changes, nausea, vomiting, reactions to medication,  itching, and post partum back pain. Confirmed with bedside nurse the patient's most recent platelet count. Confirmed with the patient that they are not taking any anticoagulation, have any bleeding history or any family history of bleeding disorders. Patient expressed understanding and wishes to proceed. All questions were answered. )  ° ° ° ° ° ° °Anesthesia Quick Evaluation ° °

## 2021-07-29 NOTE — Brief Op Note (Signed)
07/29/2021  9:33 AM  PATIENT:  Lossie Faes  30 y.o. female  PRE-OPERATIVE DIAGNOSIS:  Non reassuring fetal tracing, term gestation  POST-OPERATIVE DIAGNOSIS:  same  PROCEDURE:  emergency primary Cesarean section, kerr hysterotomy  SURGEON:  Surgeon(s) and Role:    * Maxie Better, MD - Primary  PHYSICIAN ASSISTANT:   ASSISTANTS: Dr Delray Alt  ANESTHESIA:   epidural FINDINGS;  live female OP CAN x 1, nl tubes and ovaries. Spontaneous placenta intact EBL:  386 ml   BLOOD ADMINISTERED:none  DRAINS: none   LOCAL MEDICATIONS USED:  MARCAINE     SPECIMEN:  No Specimen  DISPOSITION OF SPECIMEN:  N/A  COUNTS:  YES  TOURNIQUET:  * No tourniquets in log *  DICTATION: .Other Dictation: Dictation Number [id number]  PLAN OF CARE: Admit to inpatient   PATIENT DISPOSITION:  PACU - hemodynamically stable.   Delay start of Pharmacological VTE agent (>24hrs) due to surgical blood loss or risk of bleeding: no

## 2021-07-30 LAB — BIRTH TISSUE RECOVERY COLLECTION (PLACENTA DONATION)

## 2021-07-30 LAB — CBC
HCT: 26.9 % — ABNORMAL LOW (ref 36.0–46.0)
Hemoglobin: 9 g/dL — ABNORMAL LOW (ref 12.0–15.0)
MCH: 28.4 pg (ref 26.0–34.0)
MCHC: 33.5 g/dL (ref 30.0–36.0)
MCV: 84.9 fL (ref 80.0–100.0)
Platelets: 183 10*3/uL (ref 150–400)
RBC: 3.17 MIL/uL — ABNORMAL LOW (ref 3.87–5.11)
RDW: 14.1 % (ref 11.5–15.5)
WBC: 15.2 10*3/uL — ABNORMAL HIGH (ref 4.0–10.5)
nRBC: 0 % (ref 0.0–0.2)

## 2021-07-30 NOTE — Lactation Note (Signed)
This note was copied from a baby's chart. Lactation Consultation Note  Patient Name: Kelly [name] [id number] Date: 07/30/2021 Reason for consult: Follow-up assessment;Term  Mom nursing infant swaddled in blankets.  Upon assessing BF, LC noticed infants gape was narrow and bottom lip was slightly tucked.  Position readjusted and LC encouraged mom to wait until infant opens wide before bringing him to the breast.    Encouraged breastfeeding skin to skin and hand expressing prior to latching and EBM to nipple.    Age:28 hours  Maternal Data    Feeding Mother's Current Feeding Choice: Breast Milk  LATCH Score Latch: Grasps breast easily, tongue down, lips flanged, rhythmical sucking.  Audible Swallowing: A few with stimulation  Type of Nipple: Everted at rest and after stimulation  Comfort (Breast/Nipple): Filling, red/small blisters or bruises, mild/mod discomfort (mom states nipple was tender at first when latching but later during the feeding she did not feel any pain)  Hold (Positioning): Assistance needed to correctly position infant at breast and maintain latch.  LATCH Score: 7   Lactation Tools Discussed/Used    Interventions Interventions: Support pillows;Adjust position;Education  Discharge    Consult Status Consult Status: Follow-up Date: 07/31/21 Follow-up type: In-patient    [name] [facility] 07/30/2021, 12:43 PM

## 2021-07-30 NOTE — Progress Notes (Signed)
SVD: primary  S:  Pt reports feeling well/ Tolerating po/ Voiding without problems/ No n/v/ Bleeding is light/ Pain controlled withprescription NSAID's including ibuprofen (Motrin) and narcotic analgesics including oxycodone/acetaminophen (Percocet, Tylox)    O:  A & O x 3 / VS: Blood pressure 134/84, pulse 81, temperature 97.7 F (36.5 C), temperature source Oral, resp. rate 18, height 5\' 6"  (1.676 m), weight 82.6 kg, last menstrual period 10/26/2019, SpO2 99 %, unknown if currently breastfeeding.  LABS:  Results for orders placed or performed during the hospital encounter of 07/29/21 (from the past 24 hour(s))  CBC     Status: Abnormal   Collection Time: 07/30/21  5:28 AM  Result Value Ref Range   WBC 15.2 (H) 4.0 - 10.5 K/uL   RBC 3.17 (L) 3.87 - 5.11 MIL/uL   Hemoglobin 9.0 (L) 12.0 - 15.0 g/dL   HCT 13/01/22 (L) 69.4 - 50.3 %   MCV 84.9 80.0 - 100.0 fL   MCH 28.4 26.0 - 34.0 pg   MCHC 33.5 30.0 - 36.0 g/dL   RDW 88.8 28.0 - 03.4 %   Platelets 183 150 - 400 K/uL   nRBC 0.0 0.0 - 0.2 %  Collect bld for placenta donatation     Status: None   Collection Time: 07/30/21  5:28 AM  Result Value Ref Range   Placenta donation bld collect Collected by Laboratory     I&O: I/O last 3 completed shifts: In: 2050 [I.V.:2000; IV Piggyback:50] Out: 4523 [Urine:4075; Blood:448]   Total I/O In: -  Out: 200 [Urine:200]  Lungs: chest clear, no wheezing, rales, normal symmetric air entry  Heart: regular rate and rhythm, S1, S2 normal, no murmur, click, rub or gallop  Abdomen: soft uterus @ umb  surgical tender  Perineum: not inspected  Lochia: light  Extremities:no redness or tenderness in the calves or thighs, edema tr+    A/P: POD # 1/PPD # 1/ G1P1001  Doing well  Continue routine post partum orders

## 2021-07-30 NOTE — Progress Notes (Signed)
Kelly [name] was referred for history of depression/anxiety. * Referral screened out by Clinical Social Worker because none of the following criteria appear to apply: ~ History of anxiety/depression during this pregnancy, or of post-partum depression following prior delivery. ~ Diagnosis of anxiety and/or depression within last 3 years OR * Kelly [name]'s symptoms currently being treated with medication and/or therapy. Per chart review, Kelly [name] is currently prescribed/taking Effexor.  Please contact the Clinical Social Worker if needs arise, by [facility] request, or if Kelly [name] scores greater than 9/yes to question 10 on [place] Postpartum Depression Screen.  Celso Sickle, LCSW Clinical Social Worker [facility] Cell#: [phone]

## 2021-07-31 MED ORDER — ONDANSETRON HCL 4 MG PO TABS
4.0000 mg | ORAL_TABLET | Freq: Three times a day (TID) | ORAL | Status: DC | PRN
  Administered 2021-07-31: 4 mg via ORAL
  Filled 2021-07-31: qty 1

## 2021-07-31 NOTE — Progress Notes (Signed)
SVD: primary  S:  Pt reports feeling gas pain/ Tolerating po/ Voiding without problems/ No n/v/ Bleeding is light/ Pain controlled withprescription NSAID's including ibuprofen (Motrin) and narcotic analgesics including oxycodone/acetaminophen (Percocet, Tylox)    O:  A & O x 3 / VS: Blood pressure 117/66, pulse 67, temperature 98.1 F (36.7 C), temperature source Oral, resp. rate 16, height 5\' 6"  (1.676 m), weight 82.6 kg, last menstrual period 10/26/2019, SpO2 98 %, unknown if currently breastfeeding.  LABS: No results found for this or any previous visit (from the past 24 hour(s)).  I&O: I/O last 3 completed shifts: In: 0  Out: 2175 [Urine:2175]   No intake/output data recorded.  Lungs: chest clear, no wheezing, rales, normal symmetric air entry  Heart: regular rate and rhythm, S1, S2 normal, no murmur, click, rub or gallop  Abdomen: soft uterus surg tender firm at umb  Perineum: not inspected Breast bilateral full  Lochia: light  Extremities:no redness or tenderness in the calves or thighs, no edema    A/P: POD # 2/PPD # 2/ 2176  Doing well  Continue routine post partum orders  Anticipate d/c in am

## 2021-07-31 NOTE — Lactation Note (Signed)
This note was copied from a baby's chart. Lactation Consultation Note  Patient Name: Kelly [name] [id number] Date: 07/31/2021 Reason for consult: Follow-up assessment;Primapara;1st time breastfeeding;Term;Infant weight loss;Other (Comment);Nipple pain/trauma;Engorgement (6 % weight loss/ resolving engorgement/ MBURN Nelda Severe assisted  mom prior to [facility] visit/ per mom had pumped off 2 1/2 ozs  off each breast/ baby fed 5 mins Post circ / sleepy. LC reviewed prevention / tx of engorgement. Mom aware to call if needed.) [facility] aware that mom has been engorged today and teaching has been completed.  Age:20 hours   Maternal Data Age:20 hours  Maternal Data    Feeding Mother's Current Feeding Choice: Breast Milk  LATCH Score                    Lactation Tools Discussed/Used Tools: Pump;Flanges Flange Size: 21 Breast pump type: Double-Electric Breast Pump;Manual  Interventions Interventions: Breast feeding basics reviewed;Education  Discharge Discharge Education: Engorgement and breast care Pump: Personal  Consult Status Consult Status: Follow-up Date: 08/01/21 Follow-up type: In-patient    Kelly [name] 07/31/2021, 3:36 PM

## 2021-08-01 MED ORDER — OXYCODONE HCL 5 MG PO TABS: 5.0000 mg | ORAL_TABLET | ORAL | 0 refills | Status: AC | PRN

## 2021-08-01 MED ORDER — IBUPROFEN 600 MG PO TABS: 600.0000 mg | ORAL_TABLET | Freq: Four times a day (QID) | ORAL | 11 refills | Status: AC | PRN

## 2021-08-01 NOTE — Discharge Summary (Signed)
Postpartum Discharge Summary  Date of Service updated     Patient Name: Kelly [name] DOB: 03/26/1991 MRN: [id number]  Date of admission: 07/29/2021 Delivery date:07/29/2021  Delivering provider: Kazzandra Desaulniers  Date of discharge: 08/01/2021  Admitting diagnosis: Indication for care in labor or delivery [O75.9] Postpartum care following cesarean delivery [Z39.2] Intrauterine pregnancy: [redacted]w[redacted]d    Secondary diagnosis:  Active Problems:   Indication for care in labor or delivery   Postpartum care following cesarean delivery  Additional problems: none    Discharge diagnosis: Term Pregnancy Delivered                                              Post partum procedures: n/a Augmentation: AROM Complications: None  Hospital course: Onset of Labor With Unplanned C/S   30y.o. yo G1P1001 at 360w4das admitted in active phase on 07/29/2021. Patient had a labor course significant for AROM  with IUPC/ISE placement due to variable decelerations. The patient went for cesarean section due to Non-Reassuring FHR. Delivery details as follows: Membrane Rupture Time/Date: 8:40 AM ,07/29/2021   Delivery Method:C-Section, Low Transverse  Details of operation can be found in separate operative note. Patient had an uncomplicated postpartum course.  She is ambulating,tolerating a regular diet, passing flatus, and urinating well.  Patient is discharged home in stable condition 08/01/2021  Newborn Data: Birth date:07/29/2021  Birth time:8:55 AM  Gender:Female  Living status:Living  Apgars:8 ,9  Weight:3.49 kg   Magnesium Sulfate received: No BMZ received: No Rhophylac:No MMR:No T-DaP:Given prenatally Flu: No Transfusion:No  Physical exam  Vitals:   07/30/21 1915 07/31/21 1430 08/01/21 0011 08/01/21 0442  BP: 117/66 122/70 128/81 118/77  Pulse: 67 82 86 84  Resp: 16 16 18 18   Temp: 98.1 F (36.7 C) 98.1 F (36.7 C) 97.7 F (36.5 C) 98 F (36.7 C)  TempSrc: Oral Oral Oral Oral   SpO2:  99% 99% 99%  Weight:      Height:       General: alert, cooperative, and no distress Lochia: appropriate Uterine Fundus: firm Incision: Dressing is clean, dry, and intact DVT Evaluation: No evidence of DVT seen on physical exam. No significant calf/ankle edema. Labs: Lab Results  Component Value Date   WBC 15.2 (H) 07/30/2021   HGB 9.0 (L) 07/30/2021   HCT 26.9 (L) 07/30/2021   MCV 84.9 07/30/2021   PLT 183 07/30/2021   No flowsheet data found. Edinburgh Score: Edinburgh Postnatal Depression Scale Screening Tool 07/31/2021  I have been able to laugh and see the funny side of things. 0  I have looked forward with enjoyment to things. 0  I have blamed myself unnecessarily when things went wrong. 0  I have been anxious or worried for no good reason. 1  I have felt scared or panicky for no good reason. 1  Things have been getting on top of me. 1  I have been so unhappy that I have had difficulty sleeping. 0  I have felt sad or miserable. 1  I have been so unhappy that I have been crying. 0  The thought of harming myself has occurred to me. 0  Edinburgh Postnatal Depression Scale Total 4      After visit meds:  Allergies as of 08/01/2021   No Known Allergies      Medication List  TAKE these medications    ibuprofen 600 MG tablet Commonly known as: ADVIL Take 1 tablet (600 mg total) by mouth every 6 (six) hours as needed.   oxyCODONE 5 MG immediate release tablet Commonly known as: Oxy IR/ROXICODONE Take 1-2 tablets (5-10 mg total) by mouth every 4 (four) hours as needed for up to 7 days for moderate pain.   PRENATAL VITAMINS PO Take 1 tablet by mouth daily.   venlafaxine 37.5 MG tablet Commonly known as: EFFEXOR Take 37.5 mg by mouth in the morning.         Discharge home in stable condition Infant Feeding: Breast Infant Disposition:home with mother Discharge instruction: per After Visit Summary and Postpartum booklet. Activity: Advance as  tolerated. Pelvic rest for 6 weeks.  Diet: routine diet Anticipated Birth Control: Unsure Postpartum Appointment:6 weeks Additional Postpartum F/U:  n/a Future Appointments:No future appointments. Follow up Visit:  Follow-up Information     Servando Salina, MD Follow up.   Specialty: Obstetrics and Gynecology Why: keep scheduled appointment Contact information: [place] [address] [phone]         Servando Salina, MD Follow up in 6 week(s).   Specialty: Obstetrics and Gynecology Contact information: [address] [place]  [address] [phone]                     08/01/2021 Marvene Staff, MD

## 2021-08-01 NOTE — Discharge Instructions (Signed)
Call if temperature greater than equal to 100.4, nothing per vagina for 4-6 weeks or severe nausea vomiting, increased incisional pain , drainage or redness in the incision site, no straining with bowel movements, showers no bath °

## 2021-08-01 NOTE — Progress Notes (Signed)
SVD: primary  S:  Pt reports feeling well/ Tolerating po/ Voiding without problems/ No n/v/ Bleeding is light/ Pain controlled withprescription NSAID's including ibuprofen (Motrin) and narcotic analgesics including oxycodone/acetaminophen (Percocet, Tylox)    O:  A & O x 3 / VS: Blood pressure 118/77, pulse 84, temperature 98 F (36.7 C), temperature source Oral, resp. rate 18, height 5\' 6"  (1.676 m), weight 82.6 kg, last menstrual period 10/26/2019, SpO2 99 %, unknown if currently breastfeeding.  LABS: No results found for this or any previous visit (from the past 24 hour(s)).  I&O: No intake/output data recorded.   No intake/output data recorded.  Lungs: chest clear, no wheezing, rales, normal symmetric air entry  Heart: regular rate and rhythm, S1, S2 normal, no murmur, click, rub or gallop  Abdomen: soft uterus firm @ umb  primary dressing d/c/i  Perineum: not inspected  Lochia: light  Extremities:no redness or tenderness in the calves or thighs, no edema  Breast: full  A/P: POD # 3/PPD # 3/ G1P1001  Doing well  Continue routine post partum orders  D/c home D/c instructions reviewed F/u 6 wk

## 2021-08-01 NOTE — Lactation Note (Addendum)
This note was copied from a baby's chart. Lactation Consultation Note  Patient Name: Boy Parrie Rasco [id number] Date: 08/01/2021 Reason for consult: Follow-up assessment;1st time breastfeeding;Primapara;Engorgement;Term;Infant weight loss;Other (Comment);Nipple pain/trauma (4 % weight loss / borderline engorgement / resolved at consult . baby fed well on the left breast with depth / softened / and released the other breast with the hand pump.) Age:28 hours Per mom the LC plan from yesterday was working well / and then the engorgement started back and had to use the DEBP.  Baby awake and hungry/ mom so full with areola edema/ LC showed mom reverse pressure/ and pre pumped with #21 flange ( #24 F was to big ). Softened areola/ and baby latched / upper lip needed to flipped and eased chin / depth achieved. Baby fed for 10 mins / off short time/ re-latched. Fed total 20 mins and softened the breast. LC assisted to release the left breast down to comfort . Per mom comfortable.  LC Plan :  If breast are to full to start need to hand express , or use the hand pump with #21 flange . ( Areola needs to be thin sandwich for a deeper latch) .  If baby only feeds 1st breast / softened the other breast with hand expressing / hand pump to start.  Use DEBP if hand pump is not working to resolve engorgement.  Avoid over pumping.  LC F/U O/P apt for next week - Clinic to call mom .   Mom expressed she felt good about the [facility] plan and did not need a written plan.    Maternal Data Has patient been taught Hand Expression?: Yes  Feeding Mother's Current Feeding Choice: Breast Milk Nipple Type: Slow - flow  LATCH Score Latch: Grasps breast easily, tongue down, lips flanged, rhythmical sucking.  Audible Swallowing: Spontaneous and intermittent  Type of Nipple: Everted at rest and after stimulation  Comfort (Breast/Nipple): Filling, red/small blisters or bruises, mild/mod discomfort  Hold (Positioning):  Assistance needed to correctly position infant at breast and maintain latch.  LATCH Score: 8   Lactation Tools Discussed/Used Tools: Shells;Pump;Flanges;Coconut oil;Comfort gels Flange Size: 24;21;Other (comment) (#21 Flanged released the breast down well ( breast baby did not feed off )) Breast pump type: Manual Pump Education: Milk Storage;Setup, frequency, and cleaning Reason for Pumping: milk is in / borderline engorgement at the start of the feeding - improved Pumping frequency: PRN and when engorgement - mom has been pumping off huge volumes - see LC note  Interventions    Discharge Discharge Education: Engorgement and breast care;Warning signs for feeding baby;Outpatient recommendation;Outpatient Epic message sent;Other (comment) (mokm receptive to check on milk supply for over supply - next week and is aware she will receive a appt.) Pump: Personal;Manual;DEBP WIC Program: No  Consult Status Consult Status: Complete Date: 08/01/21    Kathrin Greathouse 08/01/2021, 10:52 AM

## 2021-08-13 ENCOUNTER — Telehealth (HOSPITAL_COMMUNITY): Payer: Self-pay

## 2021-08-13 NOTE — Telephone Encounter (Signed)
No answer. Left message to return nurse call.  Marcelino Duster [facility] 08/13/2021,2000

## 2021-10-13 IMAGING — CR DG CHEST 2V
2 series · 2 of 2 positions shown · non-contrast
Comparison: Chest x-ray 09/23/2017.

CLINICAL DATA: 28-year-old female with history of productive cough
for the past month.

EXAM:
CHEST - 2 VIEW

[w chest pa]
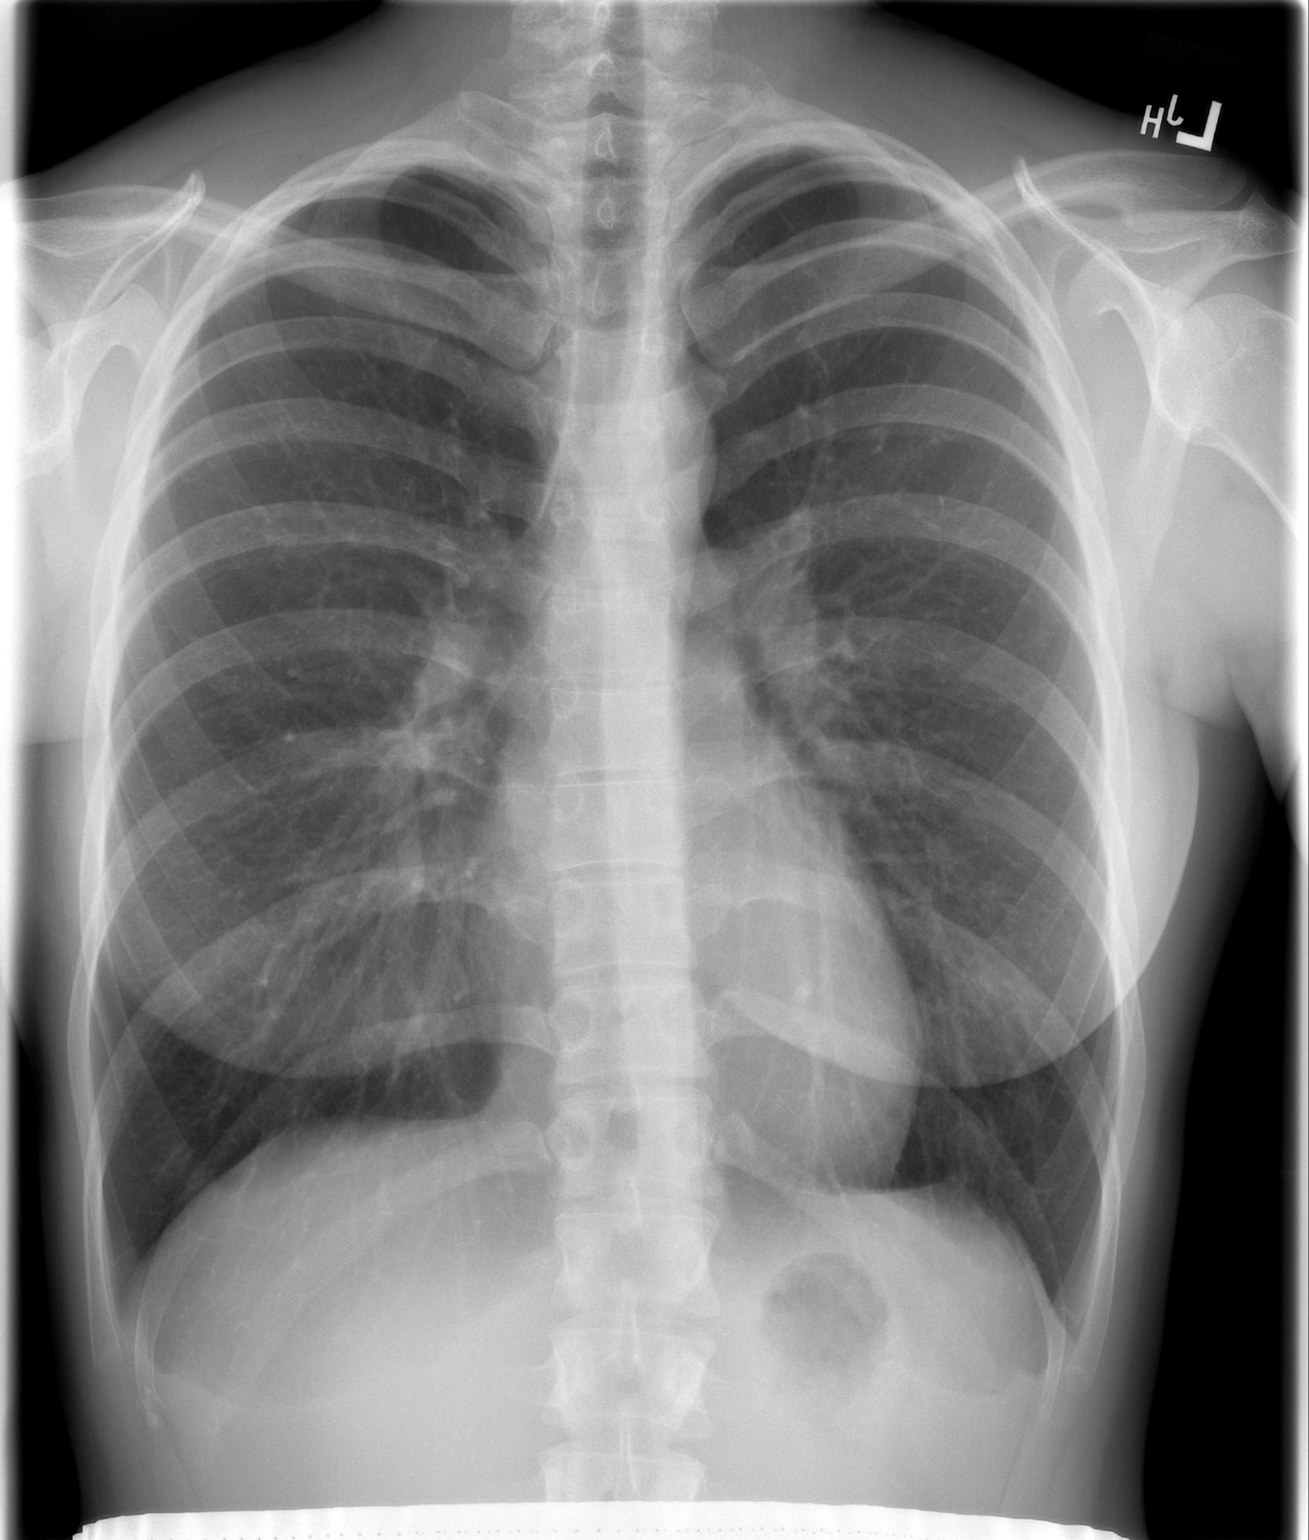

[w chest lat]
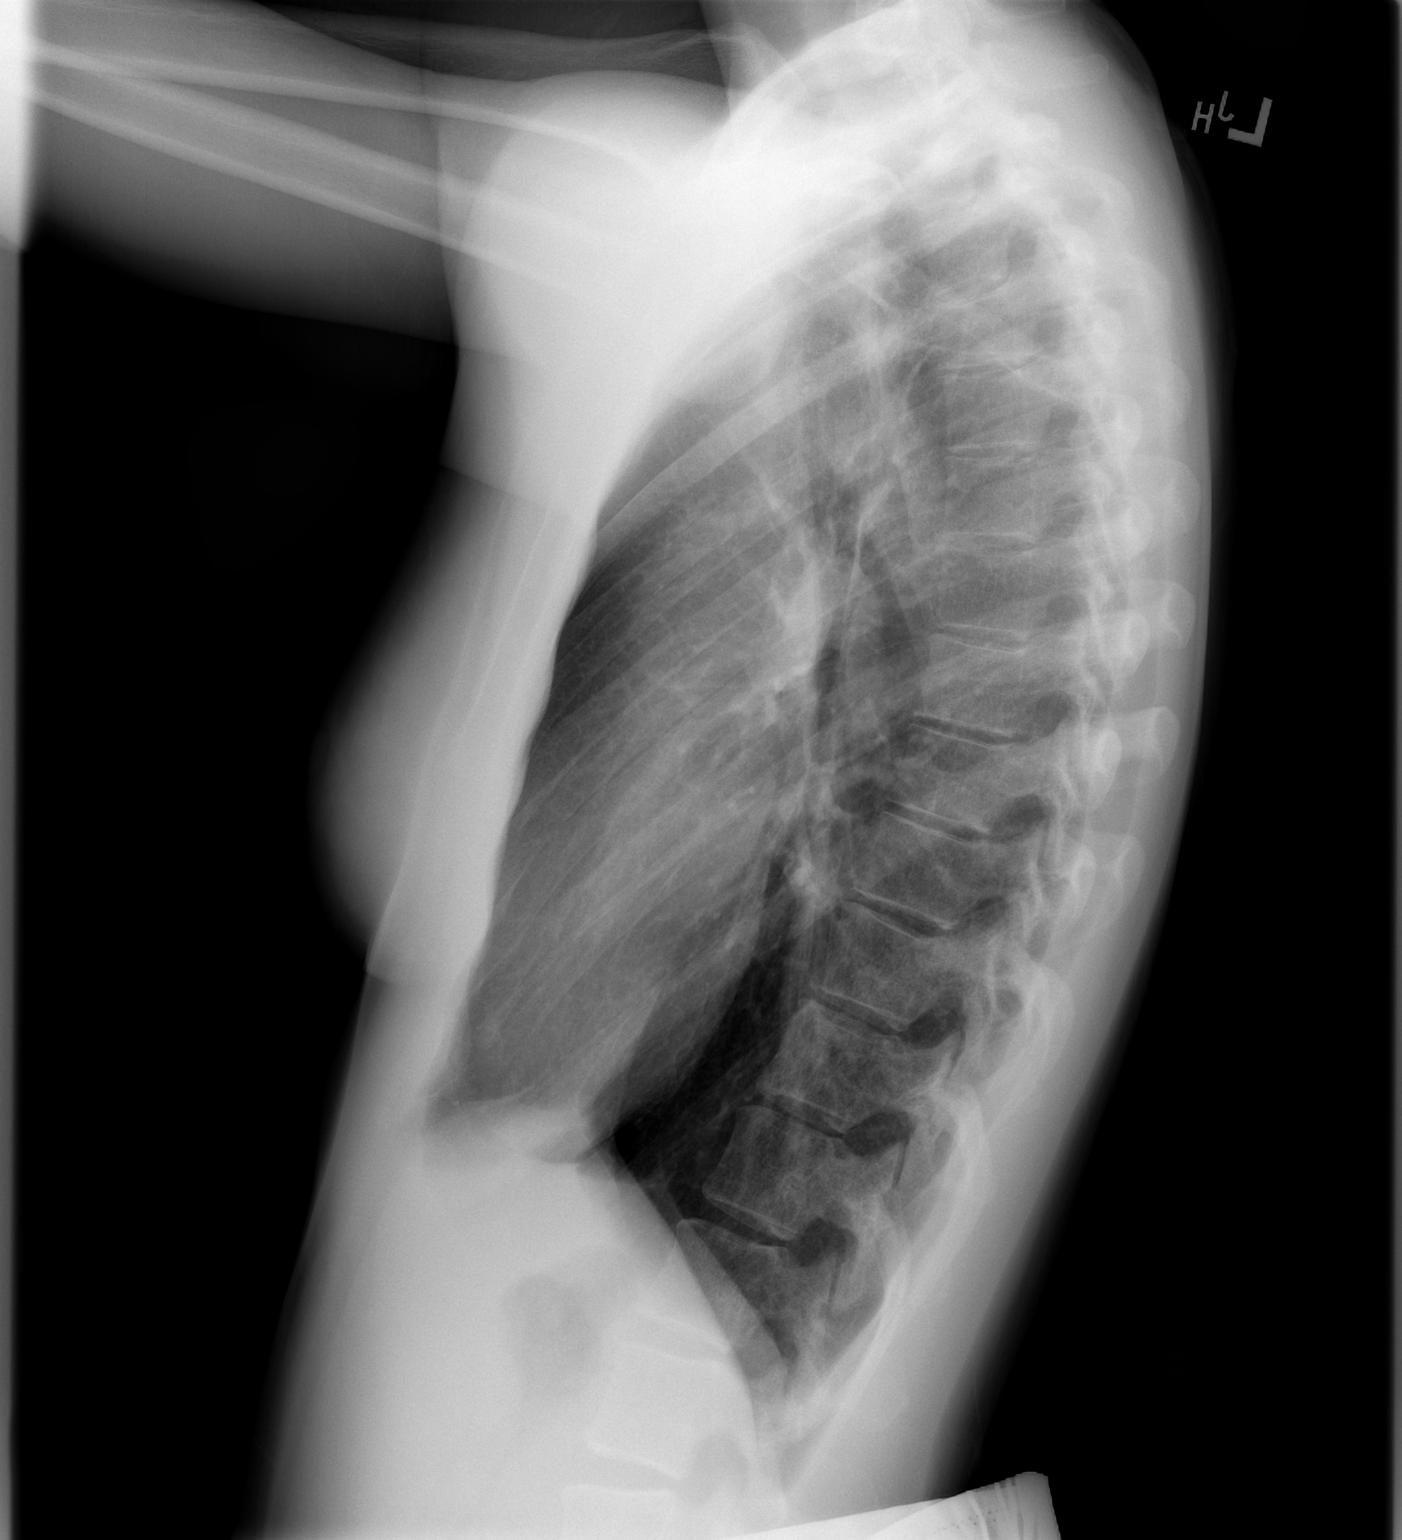

[2 of 2 positions shown; findings below may reference images not displayed]

FINDINGS: Lung volumes are normal. No consolidative airspace disease. No
pleural effusions. No pneumothorax. No pulmonary nodule or mass
noted. Pulmonary vasculature and the cardiomediastinal silhouette
are within normal limits.
IMPRESSION: No radiographic evidence of acute cardiopulmonary disease.

## 2023-04-05 IMAGING — US US OB < 14 WEEKS - US OB TV
1 series · 14 of 28 positions shown · non-contrast
Comparison: None

CLINICAL DATA: First trimester pregnancy, pregnancy of uncertain
fetal viability; LMP 10/25/2020

EXAM:
OBSTETRIC <14 WK US AND TRANSVAGINAL OB US
TECHNIQUE: Both transabdominal and transvaginal ultrasound examinations were
performed for complete evaluation of the gestation as well as the
maternal uterus, adnexal regions, and pelvic cul-de-sac.
Transvaginal technique was performed to assess early pregnancy.

[Series 1: us ob less than 14 weeks with ob transvaginal · 121 acquisitions, 14 frames shown]
[im 5/121]
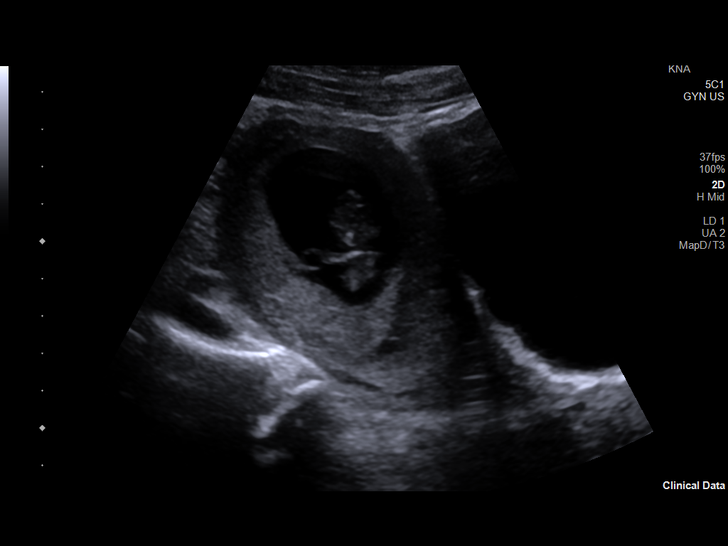
[im 14/121]
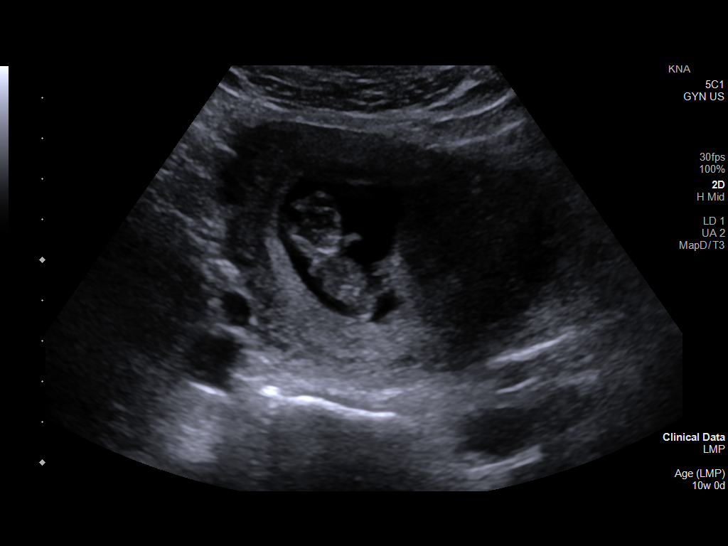
[im 23/121]
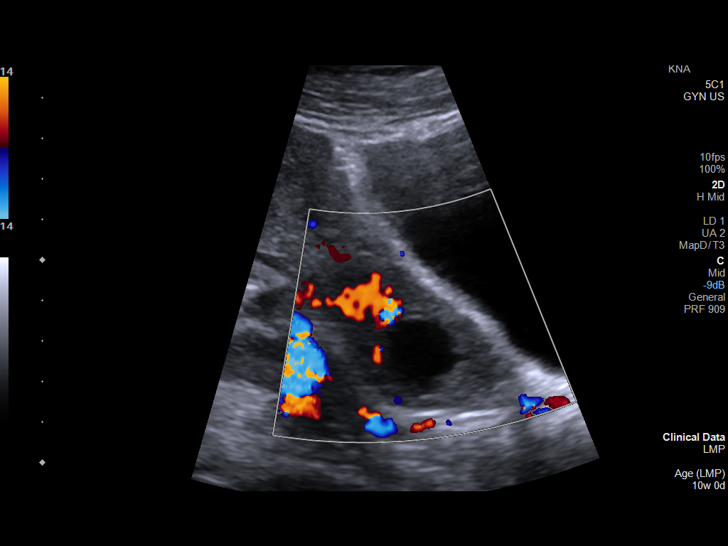
[im 32/121]
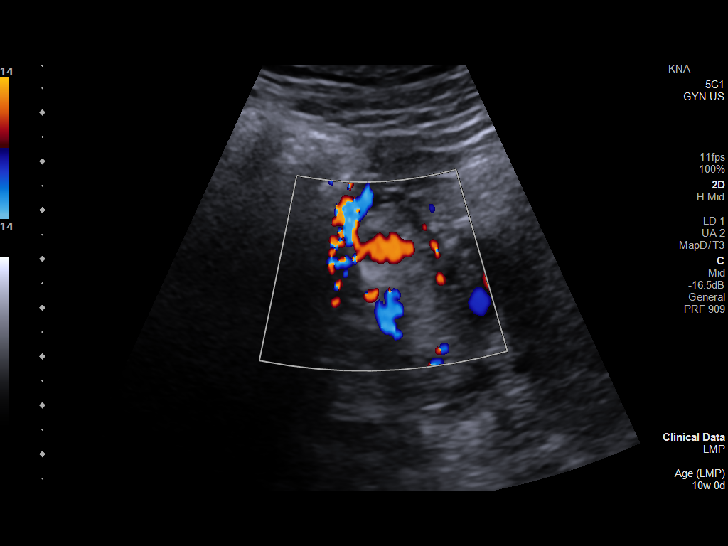
[im 41/121]
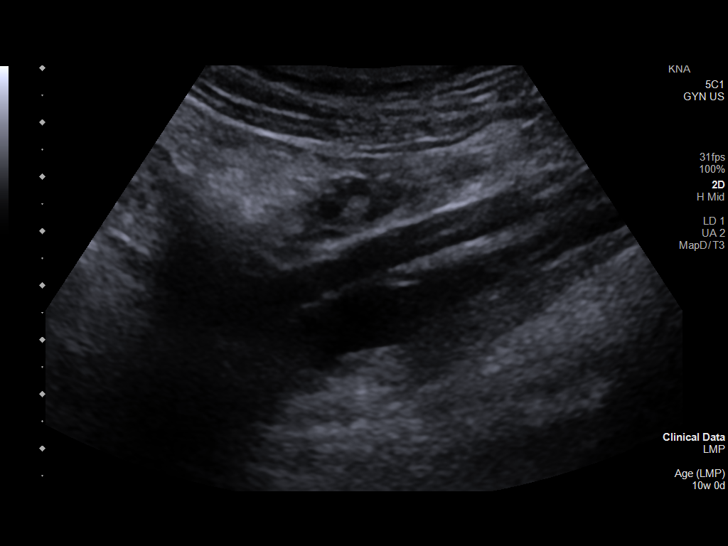
[im 49/121]
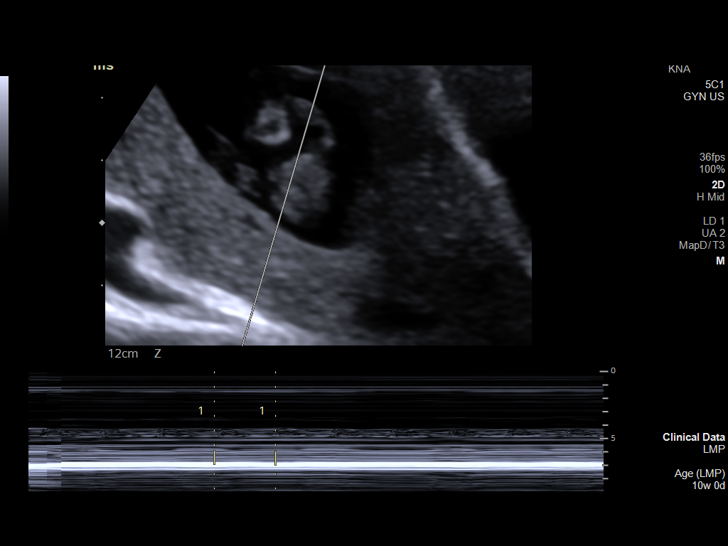
[im 58/121]
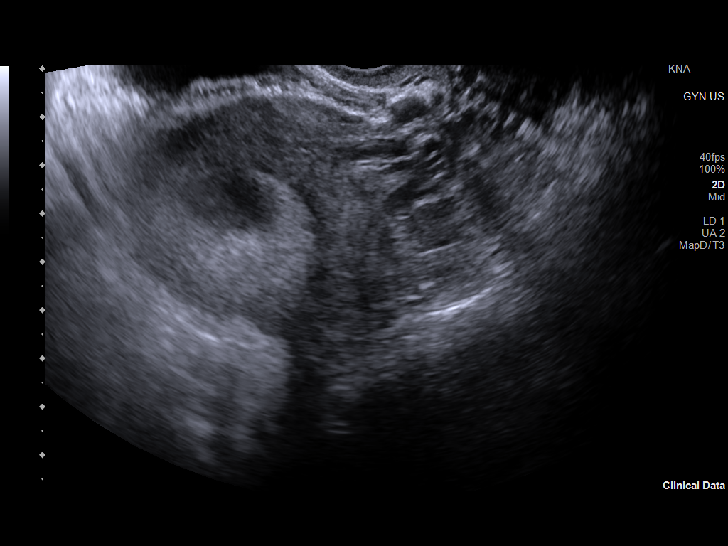
[im 67/121]
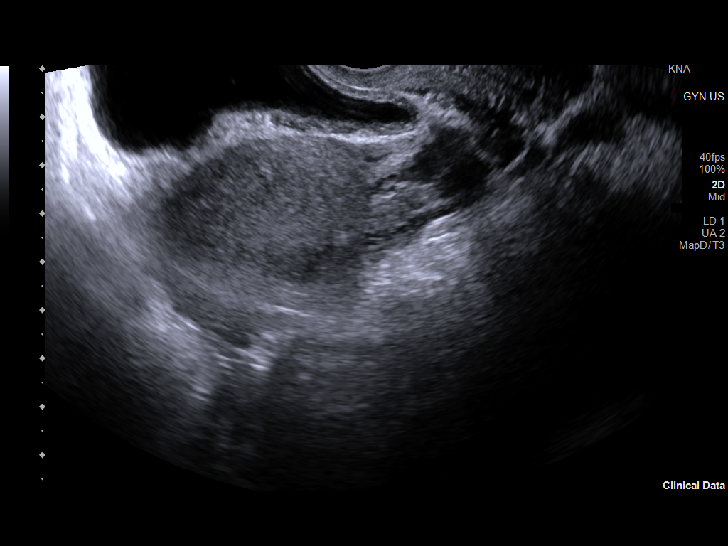
[im 76/121]
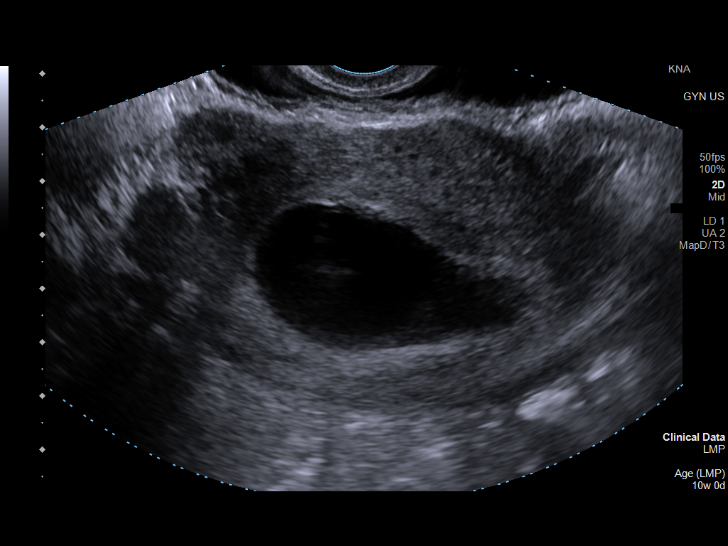
[im 85/121]
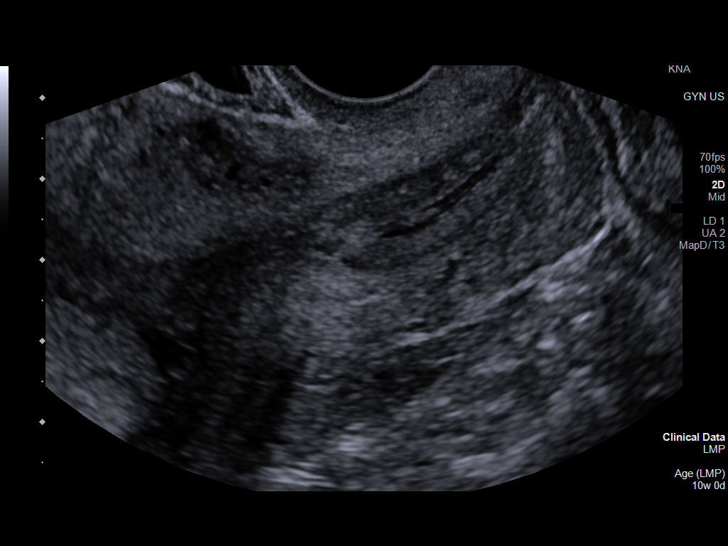
[im 94/121]
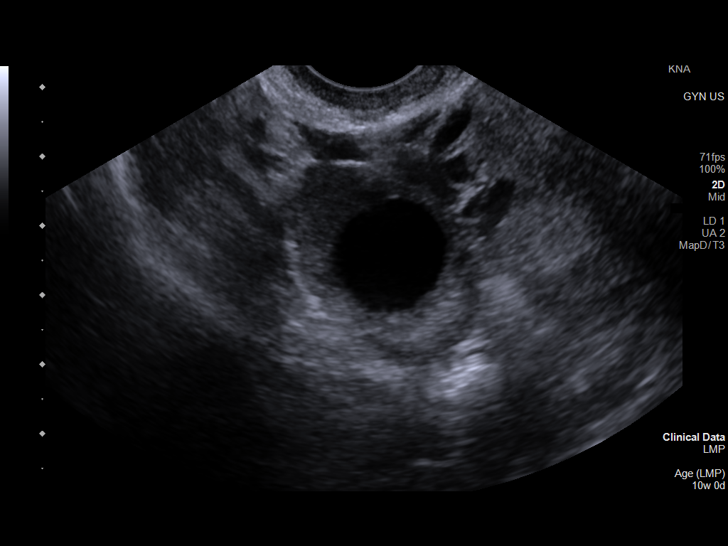
[im 103/121]
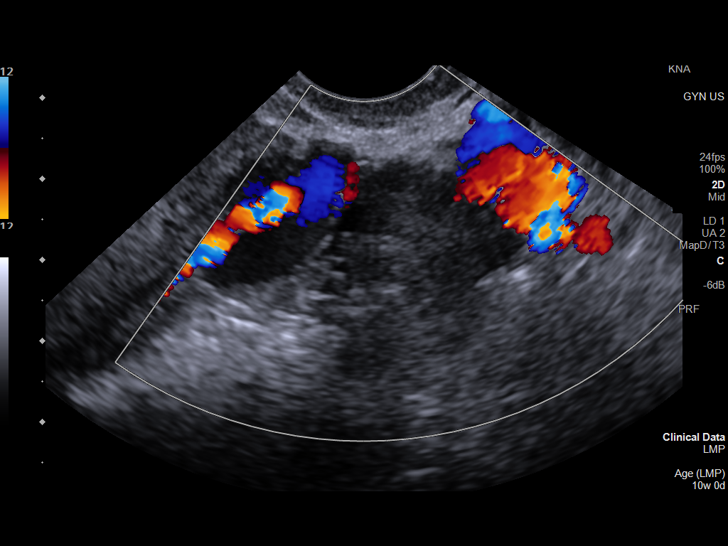
[im 112/121]
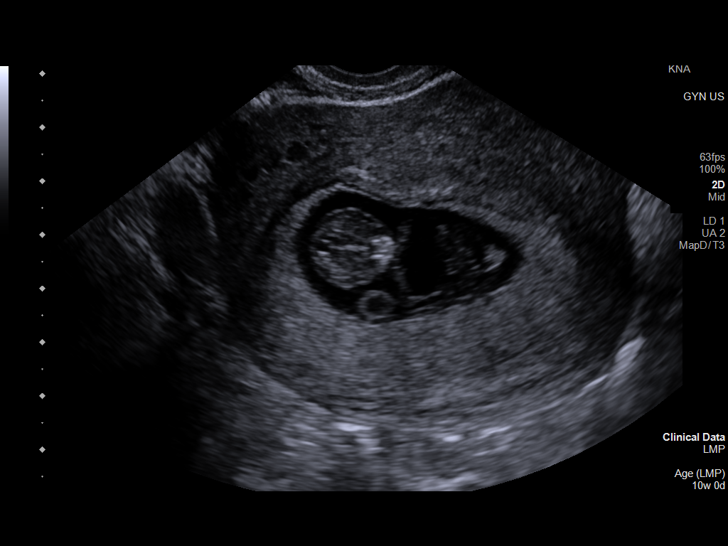
[im 121/121]
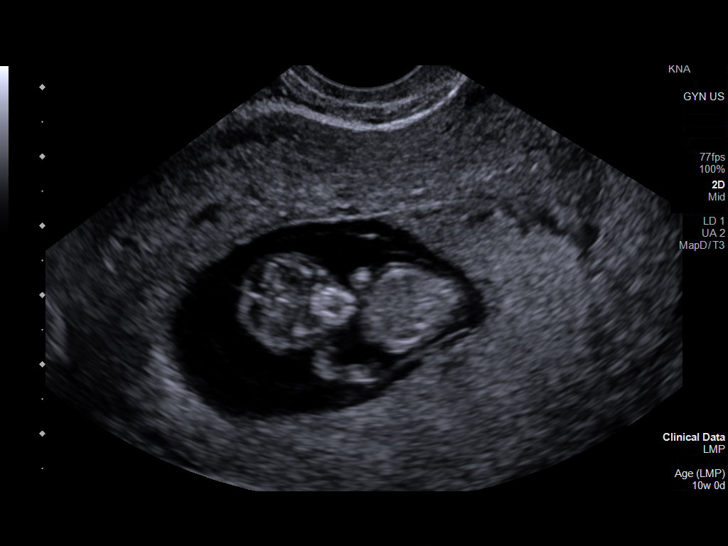

[14 of 28 positions shown; findings below may reference images not displayed]

FINDINGS: Intrauterine gestational sac: Present, single

Yolk sac:  Present

Embryo:  Present

Cardiac Activity: Present

Heart Rate: 171 bpm

CRL:  36.6 mm   10 w   4 d                  US EDC: 07/28/2021

Subchorionic hemorrhage:  None visualized.

Maternal uterus/adnexae:

Anteverted uterus, otherwise unremarkable.

Small corpus luteal cyst within RIGHT ovary.

Otherwise normal appearing ovaries.

No free pelvic fluid or adnexal masses.
IMPRESSION: Single live intrauterine gestation at 10 weeks 4 days EGA by
crown-rump length.

No acute abnormalities.

## 2023-12-10 ENCOUNTER — Other Ambulatory Visit: Payer: Self-pay | Admitting: *Deleted

## 2023-12-10 DIAGNOSIS — E221 Hyperprolactinemia: Secondary | ICD-10-CM
